# Patient Record
Sex: Female | Born: 1965 | Race: White | Hispanic: No | Smoking: Never smoker
Health system: Southern US, Community
[De-identification: ages and names within clinical notes are randomized; demographics above are authoritative.]

## PROBLEM LIST (undated history)

## (undated) DIAGNOSIS — D179 Benign lipomatous neoplasm, unspecified: Secondary | ICD-10-CM

## (undated) DIAGNOSIS — I10 Essential (primary) hypertension: Secondary | ICD-10-CM

## (undated) HISTORY — DX: Essential (primary) hypertension: I10

## (undated) HISTORY — DX: Benign lipomatous neoplasm, unspecified: D17.9

---

## 2005-10-01 ENCOUNTER — Ambulatory Visit: Payer: Self-pay | Admitting: Obstetrics and Gynecology

## 2005-12-02 HISTORY — PX: ABDOMINAL HYSTERECTOMY: SHX81

## 2005-12-02 HISTORY — PX: DILATION AND CURETTAGE OF UTERUS: SHX78

## 2006-08-06 ENCOUNTER — Other Ambulatory Visit: Payer: Self-pay

## 2006-08-06 ENCOUNTER — Ambulatory Visit: Payer: Self-pay | Admitting: Obstetrics and Gynecology

## 2006-10-21 ENCOUNTER — Ambulatory Visit: Payer: Self-pay | Admitting: Obstetrics and Gynecology

## 2006-11-21 ENCOUNTER — Ambulatory Visit: Payer: Self-pay | Admitting: Obstetrics and Gynecology

## 2007-11-05 ENCOUNTER — Ambulatory Visit: Payer: Self-pay | Admitting: Obstetrics and Gynecology

## 2008-11-17 ENCOUNTER — Ambulatory Visit: Payer: Self-pay | Admitting: Obstetrics and Gynecology

## 2009-11-21 ENCOUNTER — Ambulatory Visit: Payer: Self-pay | Admitting: Obstetrics and Gynecology

## 2010-12-05 ENCOUNTER — Ambulatory Visit: Payer: Self-pay | Admitting: Obstetrics and Gynecology

## 2012-01-07 ENCOUNTER — Ambulatory Visit: Payer: Self-pay | Admitting: Obstetrics and Gynecology

## 2012-12-02 DIAGNOSIS — D179 Benign lipomatous neoplasm, unspecified: Secondary | ICD-10-CM

## 2012-12-02 HISTORY — DX: Benign lipomatous neoplasm, unspecified: D17.9

## 2012-12-02 HISTORY — PX: OTHER SURGICAL HISTORY: SHX169

## 2013-01-19 ENCOUNTER — Ambulatory Visit: Payer: Self-pay | Admitting: Obstetrics and Gynecology

## 2013-06-23 ENCOUNTER — Encounter: Payer: Self-pay | Admitting: General Surgery

## 2013-06-23 ENCOUNTER — Ambulatory Visit (INDEPENDENT_AMBULATORY_CARE_PROVIDER_SITE_OTHER): Payer: BC Managed Care – PPO | Admitting: General Surgery

## 2013-06-23 VITALS — BP 140/78 | HR 76 | Resp 14 | Ht 68.5 in | Wt 193.0 lb

## 2013-06-23 DIAGNOSIS — D1720 Benign lipomatous neoplasm of skin and subcutaneous tissue of unspecified limb: Secondary | ICD-10-CM

## 2013-06-23 DIAGNOSIS — D2110 Benign neoplasm of connective and other soft tissue of unspecified upper limb, including shoulder: Secondary | ICD-10-CM

## 2013-06-23 NOTE — Progress Notes (Signed)
Patient ID: Catherine May, female   DOB: May 01, 1966, 47 y.o.   MRN: [id number]  Chief Complaint  Patient presents with  . Other    evaluation of left shoulder nodule    HPI Catherine May is a 47 y.o. female who presents for an evaluation of a nodule on the left shoulder. The patient states this nodule has been present for approximately 6 years. It has gotten larger over time. She denies any pain associated with the nodule.   HPI  Past Medical History  Diagnosis Date  . Hypertension     Past Surgical History  Procedure Laterality Date  . Abdominal hysterectomy  2007  . Dilation and curettage of uterus  2007    History reviewed. No pertinent family history.  Social History History  Substance Use Topics  . Smoking status: Never Smoker   . Smokeless tobacco: Never Used  . Alcohol Use: Yes    No Known Allergies  Current Outpatient Prescriptions  Medication Sig Dispense Refill  . acebutolol (SECTRAL) 200 MG capsule Take 1 capsule by mouth daily.       No current facility-administered medications for this visit.    Review of Systems Review of Systems  Constitutional: Negative.   Respiratory: Negative.   Cardiovascular: Negative.     Blood pressure 140/78, pulse 76, resp. rate 14, height 5' 8.5" (1.74 m), weight 193 lb (87.544 kg).  Physical Exam Physical Exam  Constitutional: She is oriented to person, place, and time. She appears well-developed and well-nourished.  Eyes: Conjunctivae are normal. No scleral icterus.  Neck: Neck supple. No tracheal deviation present. No thyromegaly present.  Cardiovascular: Normal rate, regular rhythm and normal heart sounds.   No murmur heard. Pulmonary/Chest: Effort normal and breath sounds normal.  Musculoskeletal:       Arms: Lymphadenopathy:    She has no cervical adenopathy.    She has no axillary adenopathy.  Neurological: She is alert and oriented to person, place, and time.  Skin: Skin is warm and dry.    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 Excision recommended given the large size.    Plan    Patient to be scheduled for out patient surgery for excision of left shoulder mass        SANKAR,SEEPLAPUTHUR G 06/23/2013, 11:12 AM

## 2013-06-23 NOTE — Patient Instructions (Addendum)
Patient to be scheduled for out patient surgery for excision of left shoulder lipoma. Procedure explained to her and she is agreeable.

## 2013-06-29 ENCOUNTER — Other Ambulatory Visit: Payer: Self-pay | Admitting: General Surgery

## 2013-06-29 DIAGNOSIS — R2232 Localized swelling, mass and lump, left upper limb: Secondary | ICD-10-CM

## 2013-07-05 ENCOUNTER — Ambulatory Visit: Payer: Self-pay | Admitting: Anesthesiology

## 2013-07-07 ENCOUNTER — Ambulatory Visit: Payer: Self-pay | Admitting: General Surgery

## 2013-07-07 DIAGNOSIS — D1739 Benign lipomatous neoplasm of skin and subcutaneous tissue of other sites: Secondary | ICD-10-CM

## 2013-07-08 ENCOUNTER — Encounter: Payer: Self-pay | Admitting: General Surgery

## 2013-07-13 ENCOUNTER — Encounter: Payer: Self-pay | Admitting: General Surgery

## 2013-07-14 ENCOUNTER — Telehealth: Payer: Self-pay | Admitting: *Deleted

## 2013-07-14 NOTE — Telephone Encounter (Signed)
Message copied by Currie Paris on Wed Jul 14, 2013  9:57 AM ------      Message from: Kieth Brightly      Created: Tue Jul 13, 2013  7:05 PM       Please let pt pt know the pathology was normal. ------

## 2013-07-14 NOTE — Telephone Encounter (Signed)
Notified patient as instructed, patient pleased. Discussed follow-up appointments, patient agrees  

## 2013-07-14 NOTE — Telephone Encounter (Signed)
Message copied by Levada Schilling on Wed Jul 14, 2013  9:55 AM ------      Message from: Kieth Brightly      Created: Tue Jul 13, 2013  7:05 PM       Please let pt pt know the pathology was normal. ------

## 2013-07-15 ENCOUNTER — Encounter: Payer: Self-pay | Admitting: General Surgery

## 2013-07-15 ENCOUNTER — Ambulatory Visit (INDEPENDENT_AMBULATORY_CARE_PROVIDER_SITE_OTHER): Payer: BC Managed Care – PPO | Admitting: General Surgery

## 2013-07-15 VITALS — BP 138/94 | HR 64 | Resp 12 | Ht 68.0 in | Wt 193.0 lb

## 2013-07-15 DIAGNOSIS — D1720 Benign lipomatous neoplasm of skin and subcutaneous tissue of unspecified limb: Secondary | ICD-10-CM

## 2013-07-15 DIAGNOSIS — D1739 Benign lipomatous neoplasm of skin and subcutaneous tissue of other sites: Secondary | ICD-10-CM

## 2013-07-15 NOTE — Progress Notes (Signed)
Patient ID: Catherine May, female   DOB: 12/03/1965, 47 y.o.   MRN: [id number]   Patient presents for a post op left shoulder lipoma excision. The procedure was done on 07/07/13. The patient denies any problems at this time. Doing well.   Incision looks clean. No signs of infection, hematoma, or seroma.    Pathology benign. Lipoma.

## 2013-07-15 NOTE — Patient Instructions (Signed)
Patient to return as needed. 

## 2013-10-07 ENCOUNTER — Other Ambulatory Visit: Payer: Self-pay

## 2014-01-24 ENCOUNTER — Ambulatory Visit: Payer: Self-pay | Admitting: Obstetrics and Gynecology

## 2014-02-10 ENCOUNTER — Ambulatory Visit: Payer: Self-pay | Admitting: Obstetrics and Gynecology

## 2014-10-03 ENCOUNTER — Encounter: Payer: Self-pay | Admitting: General Surgery

## 2015-01-25 ENCOUNTER — Ambulatory Visit: Payer: Self-pay | Admitting: Obstetrics and Gynecology

## 2015-03-24 NOTE — Op Note (Signed)
PATIENT NAME:  Catherine May, Catherine May MR#:  [id number] DATE OF BIRTH:  [birth year]  DATE OF PROCEDURE:  07/07/2013  PREOPERATIVE DIAGNOSIS: Large soft tissue mass, left shoulder.   POSTOPERATIVE DIAGNOSIS: Lipoma subcutaneous left shoulder.   PROCEDURE: Excision of lipoma left shoulder area.   SURGEON: Mckinley Jewel, M.D.   ANESTHESIA: General.   COMPLICATIONS: None.   ESTIMATED BLOOD LOSS: Minimal.   DRAINS: None.   DESCRIPTION OF PROCEDURE: The patient was put to sleep and then placed in a right lateral position and positioned appropriately with padding of all pressure points. The left shoulder area slightly posteriorly and slightly on top had an approximately 8 to 10 cm size soft mass. This area was prepped and draped out as a sterile field. An anteroposterior incision was planned in the long axis of this mass. The area was infiltrated with 10 mL 0.5% Marcaine for postoperative analgesia. A timeout procedure was performed. The skin incision was made in the subcutaneous tissue. A lobulated lipomatous mass was encountered. This was then carefully freed from all the surrounding structures until the deep portion was reached where it was noted that it was lying on top of the fascia and it did not penetrate the fascia. This was successfully excised out. Bleeding was controlled with cautery. It measured approximately 8 to 10 cm after removal. After ensuring hemostasis, the subcutaneous tissue was closed with interrupted 3-0 Vicryl stitches and the skin closed with subcuticular 4-0 Vicryl, covered with Dermabond. The procedure was well tolerated. The patient subsequently was extubated and returned to the recovery room in stable condition    ____________________________ S.Robinette Haines, MD sgs:cc D: 07/07/2013 17:45:05 ET T: 07/07/2013 22:22:01 ET JOB#: [id number]  cc: S.G. Jamal Collin, MD, <Dictator> [facility] Robinette Haines MD ELECTRONICALLY SIGNED 07/08/2013 7:58

## 2016-02-21 ENCOUNTER — Other Ambulatory Visit: Payer: Self-pay | Admitting: Obstetrics and Gynecology

## 2016-02-21 DIAGNOSIS — Z1231 Encounter for screening mammogram for malignant neoplasm of breast: Secondary | ICD-10-CM

## 2016-03-06 ENCOUNTER — Ambulatory Visit
Admission: RE | Admit: 2016-03-06 | Discharge: 2016-03-06 | Disposition: A | Discharge status: Home / Self Care | Payer: BC Managed Care – PPO | Source: Ambulatory Visit | Attending: Obstetrics and Gynecology | Admitting: Obstetrics and Gynecology

## 2016-03-06 DIAGNOSIS — Z1231 Encounter for screening mammogram for malignant neoplasm of breast: Secondary | ICD-10-CM | POA: Diagnosis present

## 2016-04-12 ENCOUNTER — Other Ambulatory Visit: Payer: Self-pay | Admitting: Gastroenterology

## 2016-04-12 DIAGNOSIS — K635 Polyp of colon: Secondary | ICD-10-CM

## 2016-04-12 DIAGNOSIS — Q438 Other specified congenital malformations of intestine: Secondary | ICD-10-CM

## 2016-04-22 ENCOUNTER — Ambulatory Visit
Admission: RE | Admit: 2016-04-22 | Discharge: 2016-04-22 | Disposition: A | Discharge status: Home / Self Care | Payer: BC Managed Care – PPO | Source: Ambulatory Visit | Attending: Gastroenterology | Admitting: Gastroenterology

## 2016-04-22 DIAGNOSIS — K635 Polyp of colon: Secondary | ICD-10-CM | POA: Insufficient documentation

## 2016-04-22 DIAGNOSIS — Q438 Other specified congenital malformations of intestine: Secondary | ICD-10-CM | POA: Diagnosis not present

## 2017-06-17 ENCOUNTER — Other Ambulatory Visit: Payer: Self-pay | Admitting: Obstetrics and Gynecology

## 2017-06-17 DIAGNOSIS — Z1239 Encounter for other screening for malignant neoplasm of breast: Secondary | ICD-10-CM

## 2017-07-04 ENCOUNTER — Ambulatory Visit
Admission: RE | Admit: 2017-07-04 | Discharge: 2017-07-04 | Disposition: A | Discharge status: Home / Self Care | Payer: BC Managed Care – PPO | Source: Ambulatory Visit | Attending: Obstetrics and Gynecology | Admitting: Obstetrics and Gynecology

## 2017-07-04 DIAGNOSIS — Z1231 Encounter for screening mammogram for malignant neoplasm of breast: Secondary | ICD-10-CM | POA: Insufficient documentation

## 2017-07-04 DIAGNOSIS — Z1239 Encounter for other screening for malignant neoplasm of breast: Secondary | ICD-10-CM

## 2018-06-18 ENCOUNTER — Other Ambulatory Visit: Payer: Self-pay | Admitting: Obstetrics and Gynecology

## 2018-06-18 DIAGNOSIS — Z1231 Encounter for screening mammogram for malignant neoplasm of breast: Secondary | ICD-10-CM

## 2018-07-15 ENCOUNTER — Ambulatory Visit
Admission: RE | Admit: 2018-07-15 | Discharge: 2018-07-15 | Disposition: A | Discharge status: Home / Self Care | Payer: BC Managed Care – PPO | Source: Ambulatory Visit | Attending: Obstetrics and Gynecology | Admitting: Obstetrics and Gynecology

## 2018-07-15 DIAGNOSIS — Z1231 Encounter for screening mammogram for malignant neoplasm of breast: Secondary | ICD-10-CM | POA: Insufficient documentation

## 2019-06-24 ENCOUNTER — Other Ambulatory Visit: Payer: Self-pay | Admitting: Obstetrics and Gynecology

## 2019-06-24 DIAGNOSIS — Z1231 Encounter for screening mammogram for malignant neoplasm of breast: Secondary | ICD-10-CM

## 2019-08-03 ENCOUNTER — Ambulatory Visit
Admission: RE | Admit: 2019-08-03 | Discharge: 2019-08-03 | Disposition: A | Discharge status: Home / Self Care | Payer: BC Managed Care – PPO | Source: Ambulatory Visit | Attending: Obstetrics and Gynecology | Admitting: Obstetrics and Gynecology

## 2019-08-03 DIAGNOSIS — Z1231 Encounter for screening mammogram for malignant neoplasm of breast: Secondary | ICD-10-CM | POA: Diagnosis present

## 2020-06-27 ENCOUNTER — Other Ambulatory Visit: Payer: Self-pay | Admitting: Obstetrics and Gynecology

## 2020-06-27 DIAGNOSIS — Z1231 Encounter for screening mammogram for malignant neoplasm of breast: Secondary | ICD-10-CM

## 2020-08-03 ENCOUNTER — Other Ambulatory Visit: Payer: Self-pay

## 2020-08-03 ENCOUNTER — Ambulatory Visit
Admission: RE | Admit: 2020-08-03 | Discharge: 2020-08-03 | Disposition: A | Discharge status: Home / Self Care | Payer: BC Managed Care – PPO | Source: Ambulatory Visit | Attending: Obstetrics and Gynecology | Admitting: Obstetrics and Gynecology

## 2020-08-03 DIAGNOSIS — Z1231 Encounter for screening mammogram for malignant neoplasm of breast: Secondary | ICD-10-CM | POA: Diagnosis present

## 2021-09-05 ENCOUNTER — Other Ambulatory Visit: Payer: Self-pay | Admitting: Obstetrics and Gynecology

## 2021-09-05 DIAGNOSIS — Z1231 Encounter for screening mammogram for malignant neoplasm of breast: Secondary | ICD-10-CM

## 2021-09-21 ENCOUNTER — Other Ambulatory Visit: Payer: Self-pay

## 2021-09-21 ENCOUNTER — Ambulatory Visit
Admission: RE | Admit: 2021-09-21 | Discharge: 2021-09-21 | Disposition: A | Discharge status: Home / Self Care | Payer: BC Managed Care – PPO | Source: Ambulatory Visit | Attending: Obstetrics and Gynecology | Admitting: Obstetrics and Gynecology

## 2021-09-21 DIAGNOSIS — Z1231 Encounter for screening mammogram for malignant neoplasm of breast: Secondary | ICD-10-CM | POA: Diagnosis present

## 2022-07-26 IMAGING — MG MM DIGITAL SCREENING BILAT W/ TOMO AND CAD
8 series · 8 of 24 positions shown · non-contrast
Comparison: Previous exam(s).

CLINICAL DATA: Screening.

EXAM:
DIGITAL SCREENING BILATERAL MAMMOGRAM WITH TOMOSYNTHESIS AND CAD
TECHNIQUE: Bilateral screening digital craniocaudal and mediolateral oblique
mammograms were obtained. Bilateral screening digital breast
tomosynthesis was performed. The images were evaluated with
computer-aided detection.

[R MLO synth-2D]
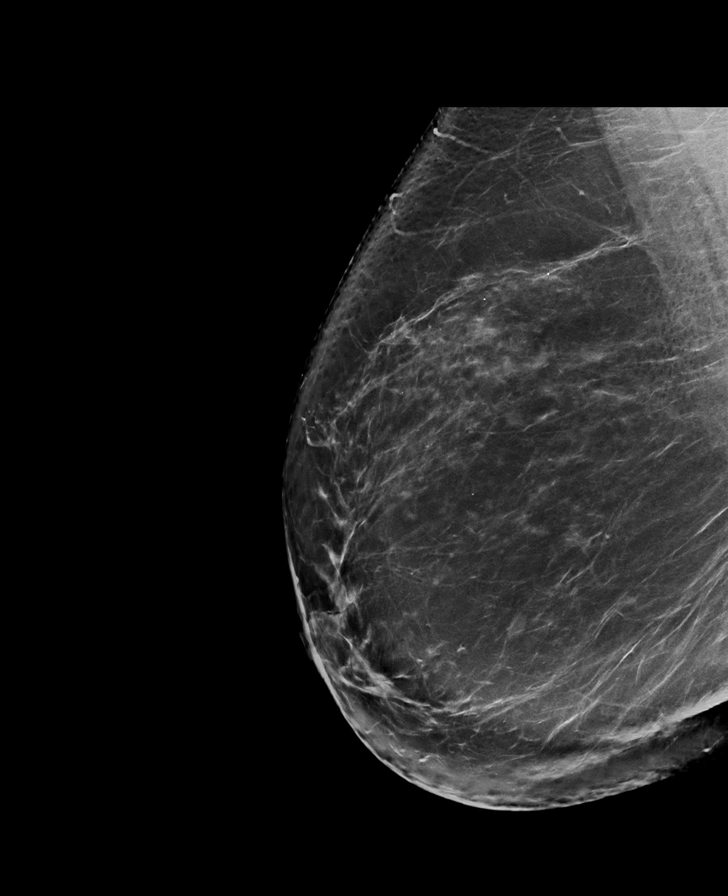

[L CC synth-2D]
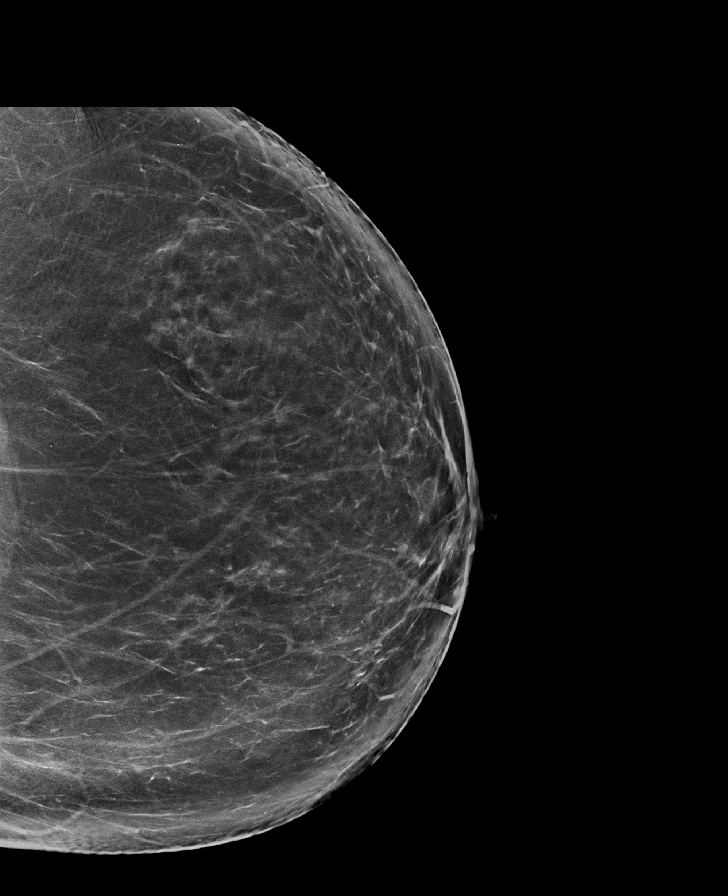

[L MLO synth-2D]
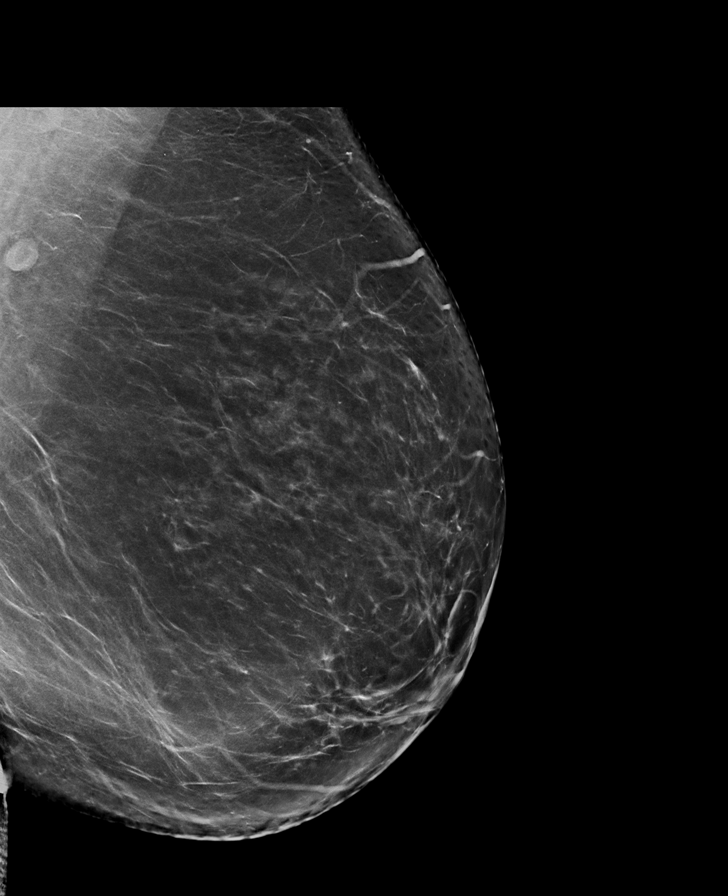

[R CC synth-2D]
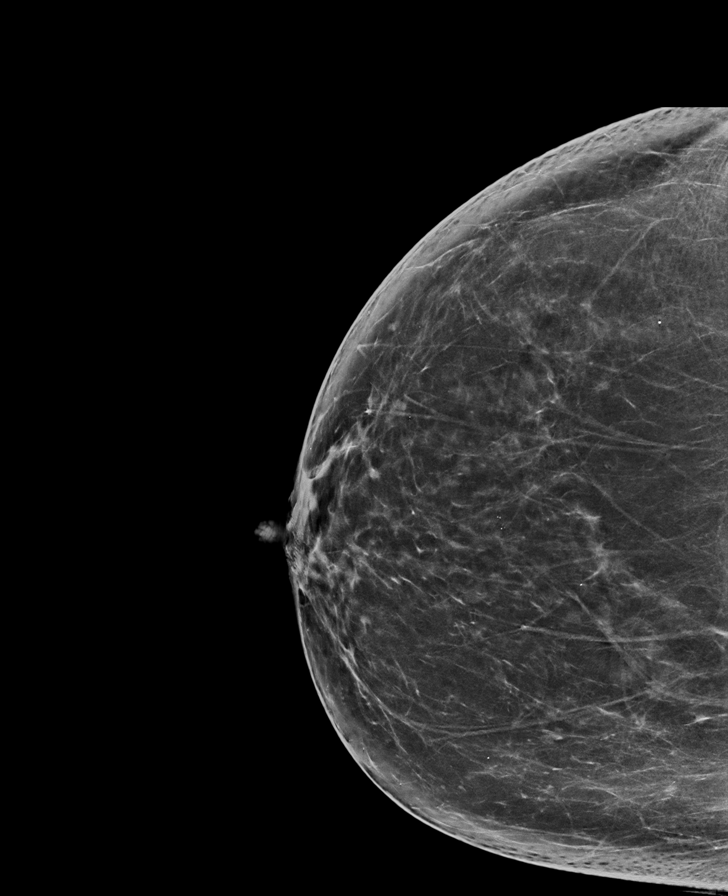

[L MLO tomo · tomo slice 50/99.0]
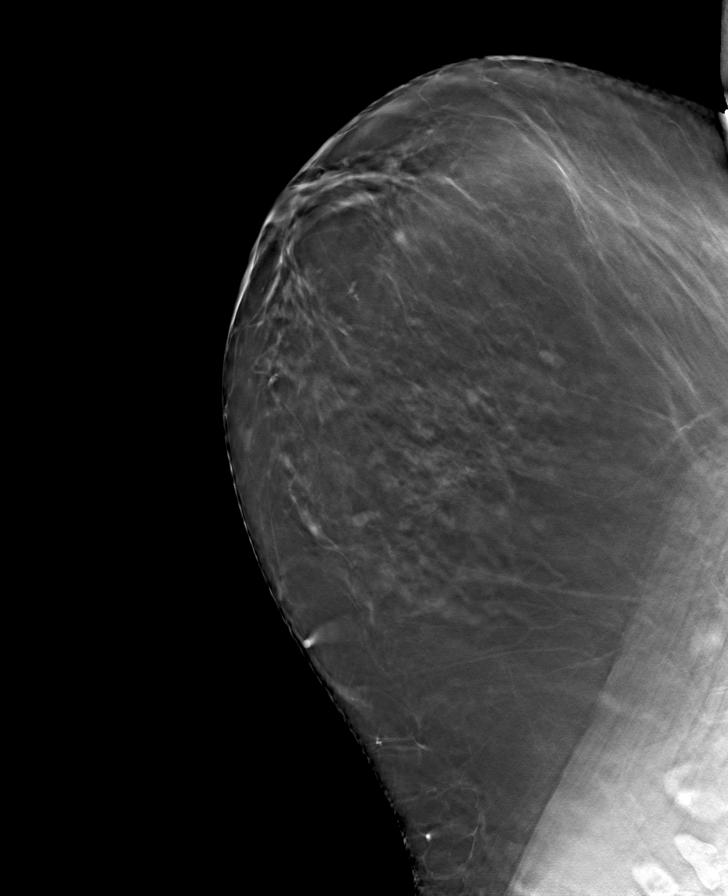

[R CC tomo · tomo slice 43/85.0]
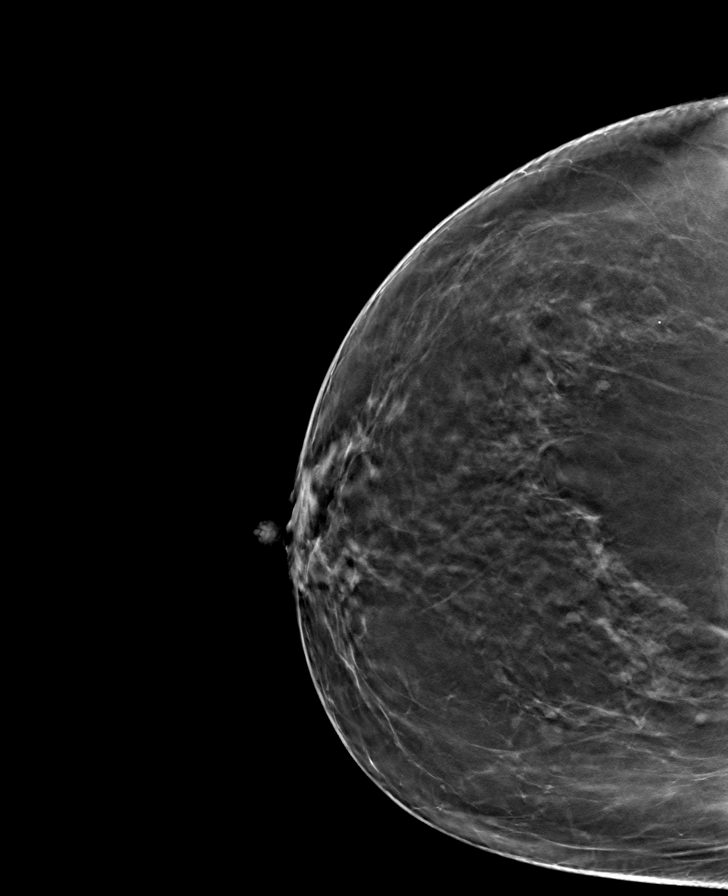

[L CC tomo · tomo slice 47/92.0]
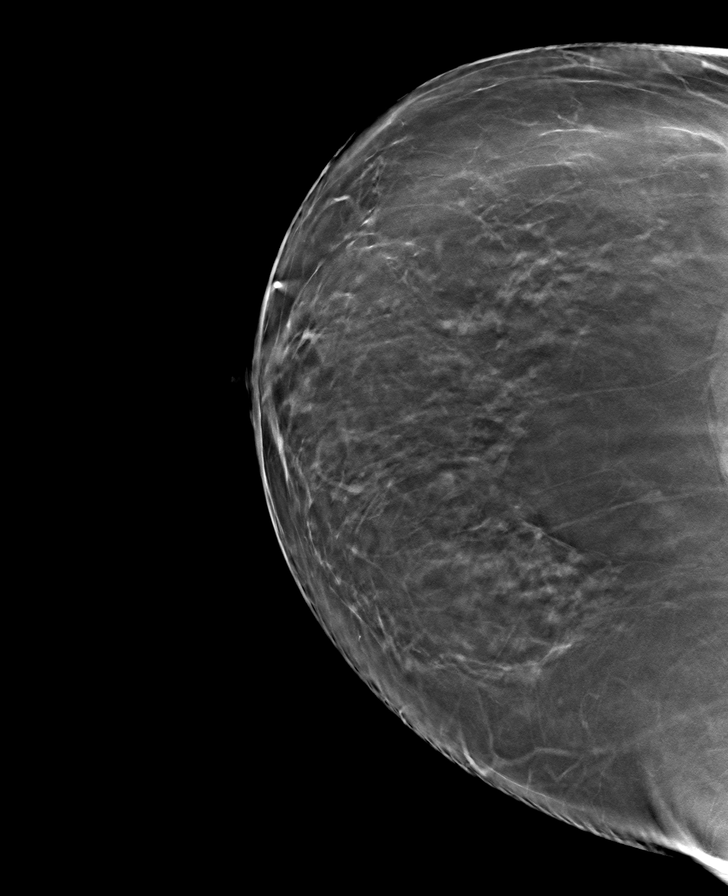

[R MLO tomo · tomo slice 49/96.0]
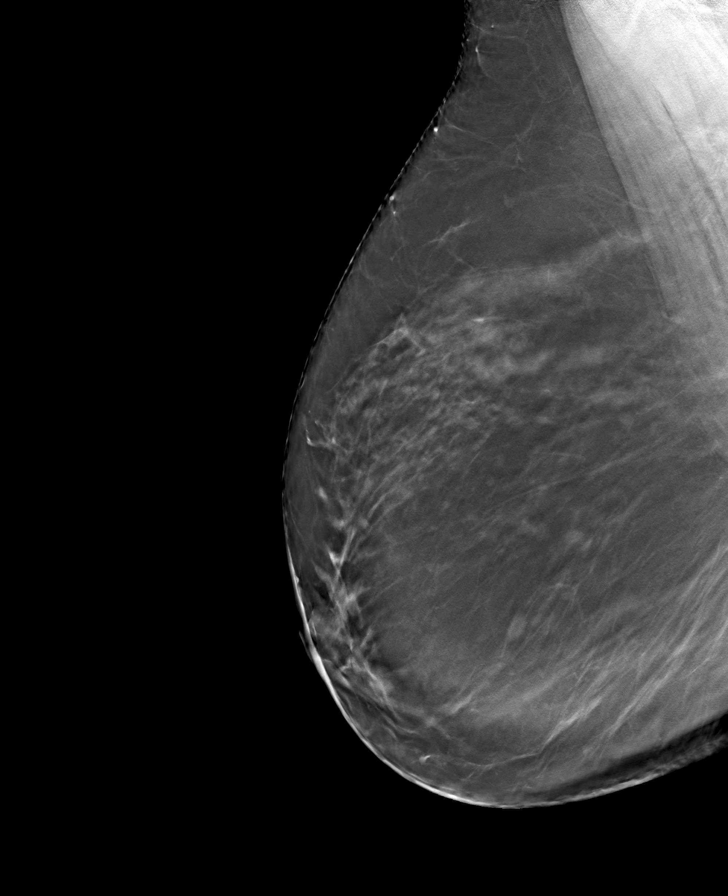

[8 of 24 positions shown; findings below may reference images not displayed]

ACR Breast Density Category b: There are scattered areas of
fibroglandular density.
FINDINGS: There are no findings suspicious for malignancy.
IMPRESSION: No mammographic evidence of malignancy. A result letter of this
screening mammogram will be mailed directly to the patient.

RECOMMENDATION:
Screening mammogram in one year. (Code:51-O-LD2)

BI-RADS CATEGORY  1: Negative.

## 2022-09-11 ENCOUNTER — Other Ambulatory Visit: Payer: Self-pay | Admitting: Obstetrics and Gynecology

## 2022-09-11 DIAGNOSIS — Z1231 Encounter for screening mammogram for malignant neoplasm of breast: Secondary | ICD-10-CM

## 2022-10-11 ENCOUNTER — Ambulatory Visit
Admission: RE | Admit: 2022-10-11 | Discharge: 2022-10-11 | Disposition: A | Discharge status: Home / Self Care | Payer: BC Managed Care – PPO | Source: Ambulatory Visit | Attending: Obstetrics and Gynecology | Admitting: Obstetrics and Gynecology

## 2022-10-11 DIAGNOSIS — Z1231 Encounter for screening mammogram for malignant neoplasm of breast: Secondary | ICD-10-CM | POA: Diagnosis present

## 2023-09-15 ENCOUNTER — Other Ambulatory Visit: Payer: Self-pay | Admitting: Obstetrics

## 2023-09-15 DIAGNOSIS — Z1231 Encounter for screening mammogram for malignant neoplasm of breast: Secondary | ICD-10-CM

## 2023-10-14 ENCOUNTER — Ambulatory Visit
Admission: RE | Admit: 2023-10-14 | Discharge: 2023-10-14 | Disposition: A | Discharge status: Home / Self Care | Payer: BC Managed Care – PPO | Source: Ambulatory Visit | Attending: Obstetrics | Admitting: Obstetrics

## 2023-10-14 DIAGNOSIS — Z1231 Encounter for screening mammogram for malignant neoplasm of breast: Secondary | ICD-10-CM | POA: Diagnosis present

## 2024-09-27 ENCOUNTER — Other Ambulatory Visit: Payer: Self-pay | Admitting: Obstetrics

## 2024-09-27 DIAGNOSIS — Z1231 Encounter for screening mammogram for malignant neoplasm of breast: Secondary | ICD-10-CM

## 2024-11-02 ENCOUNTER — Ambulatory Visit
Admission: RE | Admit: 2024-11-02 | Discharge: 2024-11-02 | Disposition: A | Discharge status: Home / Self Care | Payer: Self-pay | Source: Ambulatory Visit | Attending: Obstetrics | Admitting: Obstetrics

## 2024-11-02 DIAGNOSIS — Z1231 Encounter for screening mammogram for malignant neoplasm of breast: Secondary | ICD-10-CM | POA: Diagnosis present
